# Patient Record
Sex: Female | Born: 1985 | Race: White | Hispanic: No | Marital: Married | Smoking: Never smoker
Health system: Southern US, Community
[De-identification: ages and names within clinical notes are randomized; demographics above are authoritative.]

## PROBLEM LIST (undated history)

## (undated) ENCOUNTER — Inpatient Hospital Stay (HOSPITAL_COMMUNITY): Payer: Self-pay

## (undated) DIAGNOSIS — Z789 Other specified health status: Secondary | ICD-10-CM

---

## 2011-07-06 HISTORY — PX: VAGINAL SEPTUM RESECTION: SHX2644

## 2017-12-27 DIAGNOSIS — J069 Acute upper respiratory infection, unspecified: Secondary | ICD-10-CM | POA: Diagnosis not present

## 2018-02-17 DIAGNOSIS — Z3201 Encounter for pregnancy test, result positive: Secondary | ICD-10-CM | POA: Diagnosis not present

## 2018-03-20 DIAGNOSIS — Z3481 Encounter for supervision of other normal pregnancy, first trimester: Secondary | ICD-10-CM | POA: Diagnosis not present

## 2018-03-20 LAB — OB RESULTS CONSOLE HIV ANTIBODY (ROUTINE TESTING): HIV: NONREACTIVE

## 2018-03-20 LAB — OB RESULTS CONSOLE RUBELLA ANTIBODY, IGM: Rubella: IMMUNE

## 2018-03-20 LAB — OB RESULTS CONSOLE ANTIBODY SCREEN: Antibody Screen: NEGATIVE

## 2018-03-20 LAB — OB RESULTS CONSOLE RPR: RPR: NONREACTIVE

## 2018-03-20 LAB — OB RESULTS CONSOLE HEPATITIS B SURFACE ANTIGEN: Hepatitis B Surface Ag: NEGATIVE

## 2018-03-20 LAB — OB RESULTS CONSOLE ABO/RH: RH Type: POSITIVE

## 2018-03-20 LAB — OB RESULTS CONSOLE GC/CHLAMYDIA
Chlamydia: NEGATIVE
Gonorrhea: NEGATIVE

## 2018-04-04 DIAGNOSIS — Z3481 Encounter for supervision of other normal pregnancy, first trimester: Secondary | ICD-10-CM | POA: Diagnosis not present

## 2018-04-04 DIAGNOSIS — B850 Pediculosis due to Pediculus humanus capitis: Secondary | ICD-10-CM | POA: Diagnosis not present

## 2018-04-24 DIAGNOSIS — S7002XA Contusion of left hip, initial encounter: Secondary | ICD-10-CM | POA: Diagnosis not present

## 2018-04-24 DIAGNOSIS — Z3A15 15 weeks gestation of pregnancy: Secondary | ICD-10-CM | POA: Diagnosis not present

## 2018-04-24 DIAGNOSIS — R1032 Left lower quadrant pain: Secondary | ICD-10-CM | POA: Diagnosis not present

## 2018-04-24 DIAGNOSIS — O9A212 Injury, poisoning and certain other consequences of external causes complicating pregnancy, second trimester: Secondary | ICD-10-CM | POA: Diagnosis not present

## 2018-04-24 DIAGNOSIS — M25551 Pain in right hip: Secondary | ICD-10-CM | POA: Diagnosis not present

## 2018-04-24 DIAGNOSIS — M25552 Pain in left hip: Secondary | ICD-10-CM | POA: Diagnosis not present

## 2018-04-24 DIAGNOSIS — S79912A Unspecified injury of left hip, initial encounter: Secondary | ICD-10-CM | POA: Diagnosis not present

## 2018-05-01 DIAGNOSIS — M542 Cervicalgia: Secondary | ICD-10-CM | POA: Diagnosis not present

## 2018-05-01 DIAGNOSIS — M25511 Pain in right shoulder: Secondary | ICD-10-CM | POA: Diagnosis not present

## 2018-05-08 DIAGNOSIS — M25511 Pain in right shoulder: Secondary | ICD-10-CM | POA: Diagnosis not present

## 2018-05-08 DIAGNOSIS — M542 Cervicalgia: Secondary | ICD-10-CM | POA: Diagnosis not present

## 2018-05-08 DIAGNOSIS — M62830 Muscle spasm of back: Secondary | ICD-10-CM | POA: Diagnosis not present

## 2018-05-12 DIAGNOSIS — M25511 Pain in right shoulder: Secondary | ICD-10-CM | POA: Diagnosis not present

## 2018-05-12 DIAGNOSIS — M62830 Muscle spasm of back: Secondary | ICD-10-CM | POA: Diagnosis not present

## 2018-05-12 DIAGNOSIS — M542 Cervicalgia: Secondary | ICD-10-CM | POA: Diagnosis not present

## 2018-05-17 DIAGNOSIS — M62830 Muscle spasm of back: Secondary | ICD-10-CM | POA: Diagnosis not present

## 2018-05-17 DIAGNOSIS — M25511 Pain in right shoulder: Secondary | ICD-10-CM | POA: Diagnosis not present

## 2018-05-17 DIAGNOSIS — M542 Cervicalgia: Secondary | ICD-10-CM | POA: Diagnosis not present

## 2018-05-19 DIAGNOSIS — M25511 Pain in right shoulder: Secondary | ICD-10-CM | POA: Diagnosis not present

## 2018-05-19 DIAGNOSIS — M542 Cervicalgia: Secondary | ICD-10-CM | POA: Diagnosis not present

## 2018-05-19 DIAGNOSIS — M62830 Muscle spasm of back: Secondary | ICD-10-CM | POA: Diagnosis not present

## 2018-05-23 DIAGNOSIS — M62830 Muscle spasm of back: Secondary | ICD-10-CM | POA: Diagnosis not present

## 2018-05-23 DIAGNOSIS — M542 Cervicalgia: Secondary | ICD-10-CM | POA: Diagnosis not present

## 2018-05-23 DIAGNOSIS — M25511 Pain in right shoulder: Secondary | ICD-10-CM | POA: Diagnosis not present

## 2018-05-24 DIAGNOSIS — M542 Cervicalgia: Secondary | ICD-10-CM | POA: Diagnosis not present

## 2018-05-24 DIAGNOSIS — M25511 Pain in right shoulder: Secondary | ICD-10-CM | POA: Diagnosis not present

## 2018-05-24 DIAGNOSIS — M62830 Muscle spasm of back: Secondary | ICD-10-CM | POA: Diagnosis not present

## 2018-05-26 DIAGNOSIS — M25511 Pain in right shoulder: Secondary | ICD-10-CM | POA: Diagnosis not present

## 2018-05-26 DIAGNOSIS — M62830 Muscle spasm of back: Secondary | ICD-10-CM | POA: Diagnosis not present

## 2018-05-26 DIAGNOSIS — M542 Cervicalgia: Secondary | ICD-10-CM | POA: Diagnosis not present

## 2018-05-28 DIAGNOSIS — M542 Cervicalgia: Secondary | ICD-10-CM | POA: Diagnosis not present

## 2018-05-28 DIAGNOSIS — M62830 Muscle spasm of back: Secondary | ICD-10-CM | POA: Diagnosis not present

## 2018-05-28 DIAGNOSIS — M25511 Pain in right shoulder: Secondary | ICD-10-CM | POA: Diagnosis not present

## 2018-06-06 DIAGNOSIS — M62830 Muscle spasm of back: Secondary | ICD-10-CM | POA: Diagnosis not present

## 2018-06-06 DIAGNOSIS — M542 Cervicalgia: Secondary | ICD-10-CM | POA: Diagnosis not present

## 2018-06-06 DIAGNOSIS — M25511 Pain in right shoulder: Secondary | ICD-10-CM | POA: Diagnosis not present

## 2018-06-13 DIAGNOSIS — M542 Cervicalgia: Secondary | ICD-10-CM | POA: Diagnosis not present

## 2018-06-13 DIAGNOSIS — M25511 Pain in right shoulder: Secondary | ICD-10-CM | POA: Diagnosis not present

## 2018-06-13 DIAGNOSIS — M62830 Muscle spasm of back: Secondary | ICD-10-CM | POA: Diagnosis not present

## 2018-06-15 DIAGNOSIS — M62830 Muscle spasm of back: Secondary | ICD-10-CM | POA: Diagnosis not present

## 2018-06-15 DIAGNOSIS — M542 Cervicalgia: Secondary | ICD-10-CM | POA: Diagnosis not present

## 2018-06-15 DIAGNOSIS — M25511 Pain in right shoulder: Secondary | ICD-10-CM | POA: Diagnosis not present

## 2018-06-20 DIAGNOSIS — M542 Cervicalgia: Secondary | ICD-10-CM | POA: Diagnosis not present

## 2018-06-20 DIAGNOSIS — M25511 Pain in right shoulder: Secondary | ICD-10-CM | POA: Diagnosis not present

## 2018-06-20 DIAGNOSIS — M62830 Muscle spasm of back: Secondary | ICD-10-CM | POA: Diagnosis not present

## 2018-06-26 ENCOUNTER — Other Ambulatory Visit: Payer: Self-pay | Admitting: Family Medicine

## 2018-06-26 DIAGNOSIS — M542 Cervicalgia: Secondary | ICD-10-CM

## 2018-07-05 NOTE — L&D Delivery Note (Signed)
Delivery Note At 4:24 AM a viable female was delivered via Vaginal, Spontaneous (Presentation: DOA;  ).  APGAR: 9, 9; weight  . pending  Placenta status: spontaneous, intact, .  Cord: 3vc with the following complications: noen .  Cord pH: n/a  Anesthesia:  epidural Episiotomy: None Lacerations: 3rd degree;Perineal Suture Repair: 2.0 vicryl rapide used in P-I-S fashion (all stitches thrown first, then tied) after first securing hemostasis with 2 figure of 8s in deeper vaginal tissue where bleeding varicosities were noted. 3-0 vicryl to close remainder of laceration in standard fashion. Rectal exam confirmed integrity.  Est. Blood Loss (mL): 840  Mom to postpartum.  Baby to Couplet care / Skin to Skin.  Lendon Colonel 10/06/2018, 5:12 AM

## 2018-07-11 ENCOUNTER — Ambulatory Visit
Admission: RE | Admit: 2018-07-11 | Discharge: 2018-07-11 | Disposition: A | Discharge status: Home / Self Care | Payer: 59 | Source: Ambulatory Visit | Attending: Family Medicine | Admitting: Family Medicine

## 2018-07-11 DIAGNOSIS — M542 Cervicalgia: Secondary | ICD-10-CM

## 2018-07-20 DIAGNOSIS — O99012 Anemia complicating pregnancy, second trimester: Secondary | ICD-10-CM | POA: Diagnosis not present

## 2018-07-20 DIAGNOSIS — Z3A27 27 weeks gestation of pregnancy: Secondary | ICD-10-CM | POA: Diagnosis not present

## 2018-08-02 DIAGNOSIS — Z23 Encounter for immunization: Secondary | ICD-10-CM | POA: Diagnosis not present

## 2018-08-15 ENCOUNTER — Other Ambulatory Visit: Payer: Self-pay

## 2018-08-15 ENCOUNTER — Encounter (HOSPITAL_COMMUNITY): Payer: Self-pay

## 2018-08-15 ENCOUNTER — Inpatient Hospital Stay (HOSPITAL_COMMUNITY)
Admission: AD | Admit: 2018-08-15 | Discharge: 2018-08-15 | Disposition: A | Discharge status: Home / Self Care | Payer: 59 | Source: Ambulatory Visit | Attending: Obstetrics | Admitting: Obstetrics

## 2018-08-15 DIAGNOSIS — R0602 Shortness of breath: Secondary | ICD-10-CM | POA: Insufficient documentation

## 2018-08-15 DIAGNOSIS — O26893 Other specified pregnancy related conditions, third trimester: Secondary | ICD-10-CM | POA: Insufficient documentation

## 2018-08-15 DIAGNOSIS — Z3A31 31 weeks gestation of pregnancy: Secondary | ICD-10-CM | POA: Insufficient documentation

## 2018-08-15 HISTORY — DX: Other specified health status: Z78.9

## 2018-08-15 LAB — COMPREHENSIVE METABOLIC PANEL
ALBUMIN: 3.1 g/dL — AB (ref 3.5–5.0)
ALK PHOS: 72 U/L (ref 38–126)
ALT: 7 U/L (ref 0–44)
AST: 15 U/L (ref 15–41)
Anion gap: 8 (ref 5–15)
BUN: 9 mg/dL (ref 6–20)
CO2: 22 mmol/L (ref 22–32)
Calcium: 8.4 mg/dL — ABNORMAL LOW (ref 8.9–10.3)
Chloride: 100 mmol/L (ref 98–111)
Creatinine, Ser: 0.58 mg/dL (ref 0.44–1.00)
GFR calc Af Amer: >60 mL/min (ref >60)
GFR calc non Af Amer: >60 mL/min (ref >60)
GLUCOSE: 76 mg/dL (ref 70–99)
Potassium: 3.5 mmol/L (ref 3.5–5.1)
Sodium: 130 mmol/L — ABNORMAL LOW (ref 135–145)
Total Bilirubin: 0.8 mg/dL (ref 0.3–1.2)
Total Protein: 6.1 g/dL — ABNORMAL LOW (ref 6.5–8.1)

## 2018-08-15 LAB — CBC
HCT: 31.1 % — ABNORMAL LOW (ref 36.0–46.0)
HEMOGLOBIN: 10.5 g/dL — AB (ref 12.0–15.0)
MCH: 31.8 pg (ref 26.0–34.0)
MCHC: 33.8 g/dL (ref 30.0–36.0)
MCV: 94.2 fL (ref 80.0–100.0)
Platelets: 145 10*3/uL — ABNORMAL LOW (ref 150–400)
RBC: 3.3 MIL/uL — ABNORMAL LOW (ref 3.87–5.11)
RDW: 12.3 % (ref 11.5–15.5)
WBC: 8.9 10*3/uL (ref 4.0–10.5)
nRBC: 0 % (ref 0.0–0.2)

## 2018-08-15 LAB — URINALYSIS, ROUTINE W REFLEX MICROSCOPIC
Bilirubin Urine: NEGATIVE
Glucose, UA: NEGATIVE mg/dL
Hgb urine dipstick: NEGATIVE
Ketones, ur: 15 mg/dL — AB
Nitrite: NEGATIVE
Protein, ur: NEGATIVE mg/dL
Specific Gravity, Urine: 1.01 (ref 1.005–1.030)
pH: 7 (ref 5.0–8.0)

## 2018-08-15 LAB — URINALYSIS, MICROSCOPIC (REFLEX)
Bacteria, UA: NONE SEEN
RBC / HPF: NONE SEEN RBC/hpf (ref 0–5)

## 2018-08-15 LAB — TSH: TSH: 1.388 u[IU]/mL (ref 0.350–4.500)

## 2018-08-15 MED ORDER — SODIUM CHLORIDE 0.9 % IV BOLUS
1000.0000 mL | Freq: Once | INTRAVENOUS | Status: AC
  Administered 2018-08-15: 1000 mL via INTRAVENOUS

## 2018-08-15 MED ORDER — ALPRAZOLAM 0.5 MG PO TABS: 0.5000 mg | ORAL_TABLET | ORAL | 0 refills | Status: DC | PRN

## 2018-08-15 MED ORDER — ALPRAZOLAM 0.5 MG PO TABS
0.5000 mg | ORAL_TABLET | Freq: Once | ORAL | Status: AC
  Administered 2018-08-15: 0.5 mg via ORAL
  Filled 2018-08-15: qty 1

## 2018-08-15 MED ORDER — PANTOPRAZOLE SODIUM 40 MG PO TBEC: 40.0000 mg | DELAYED_RELEASE_TABLET | Freq: Every day | ORAL | 1 refills | Status: DC

## 2018-08-15 MED ORDER — FAMOTIDINE IN NACL 20-0.9 MG/50ML-% IV SOLN
40.0000 mg | Freq: Once | INTRAVENOUS | Status: AC
  Administered 2018-08-15: 40 mg via INTRAVENOUS
  Filled 2018-08-15: qty 100

## 2018-08-15 MED ORDER — ALUM & MAG HYDROXIDE-SIMETH 200-200-20 MG/5ML PO SUSP
30.0000 mL | Freq: Once | ORAL | Status: AC
  Administered 2018-08-15: 30 mL via ORAL
  Filled 2018-08-15: qty 30

## 2018-08-15 NOTE — MAU Note (Signed)
Pt. States she began feeling SOB last night around 11:30pm.  Pt was seen in office this morning for same complaint.  Pt denies any pain and reports good fetal movement.  Pt denies LOF or vaginal bleeding.  Pt reports some dizziness at office visit.

## 2018-08-15 NOTE — H&P (Signed)
Chief complaint shortness of breath  History of present illness 33 year old G3 P2 at 31 weeks and 4 days who developed shortness of breath last night.  This started after she went up the stairs.  Eventually she was able to sleep and slept without problem.  This a.m. when she woke up and walked downstairs a shortness of breath returned.  Patient notes no cough, no hemoptysis, no fever, no headache, no vision change, no chest pain.  Patient does note history of anxiety and prior pregnancies but has not been treated for anxiety or depression in the past.  Patient does note worsening heartburn through this pregnancy.  Patient has been followed for thrombocytopenia.  Patient does not smoke.  Patient has no personal or family history of venous thromboembolism.  Patient reports no swelling in her legs or new onset edema.  This morning she presented to the office for an evaluation.  Initial orthostatics were positive for increase in her pulse but after several minutes in the office orthostatics were negative blood pressure was normal pulse remained in the mid 90s with oxygen saturation at 100%.  She had an normal exam other than appearing dyspneic.  Patient was sent MAU for further evaluation  Past Medical History:  Diagnosis Date  . Medical history non-contributory     Past Surgical History:  Procedure Laterality Date  . NO PAST SURGERIES      Past obstetric history: 2 prior vaginal deliveries  Physical exam:  Vitals:   08/15/18 1201  BP: 106/65  Pulse: 98  Resp: 20  Temp: 98.1 F (36.7 C)  SpO2: 100%  Weight: 73.9 kg  Height: 5\' 9"  (1.753 m)   MAU course: Patient received 1 L IV fluids, labs are drawn, she received a GI cocktail and IV Pepcid.  After that her shortness of breath resolved.  Due to delay of pharmacy her Xanax was given after other interventions.  That made her feel sleepy but did not further improve her course.  Exam repeated by me after interventions: General:  Well-appearing, no distress Skin: Warm and dry Neck: No thyromegaly Cardiovascular: Regular rate and rhythm, no murmur rub or gallop Pulmonary: Clear to auscultation bilaterally Abdomen: No right upper quadrant pain, no costovertebral angle tenderness, no fundal tenderness GU: Deferred Lower extremity: Nontender, no edema, cluster of spider veins along the right ankle  Toco: None NST: 145's, positive acceleration, no decelerations, 10 beat variability   CBC    Component Value Date/Time   WBC 8.9 08/15/2018 1238   RBC 3.30 (L) 08/15/2018 1238   HGB 10.5 (L) 08/15/2018 1238   HCT 31.1 (L) 08/15/2018 1238   PLT 145 (L) 08/15/2018 1238   MCV 94.2 08/15/2018 1238   MCH 31.8 08/15/2018 1238   MCHC 33.8 08/15/2018 1238   RDW 12.3 08/15/2018 1238    CMP     Component Value Date/Time   NA 130 (L) 08/15/2018 1238   K 3.5 08/15/2018 1238   CL 100 08/15/2018 1238   CO2 22 08/15/2018 1238   GLUCOSE 76 08/15/2018 1238   BUN 9 08/15/2018 1238   CREATININE 0.58 08/15/2018 1238   CALCIUM 8.4 (L) 08/15/2018 1238   PROT 6.1 (L) 08/15/2018 1238   ALBUMIN 3.1 (L) 08/15/2018 1238   AST 15 08/15/2018 1238   ALT 7 08/15/2018 1238   ALKPHOS 72 08/15/2018 1238   BILITOT 0.8 08/15/2018 1238   GFRNONAA >60 08/15/2018 1238   GFRAA >60 08/15/2018 1238   TSH pending  Assessment and plan:  33 year old G3 P2 with acute shortness of breath of unclear etiology.  Suspect combination of GERD and possible anxiety.  Now with evidence of infection, suspicion for DVT/PE is low and further imaging is deferred.  Warning symptoms reviewed with patient.  Will start on Protonix and give as needed Xanax.  Continue routine office follow-up  Reactive fetal testing   Lendon Colonel 08/15/2018 2:33 PM

## 2018-08-15 NOTE — MAU Provider Note (Signed)
Ms. Kristine Collins is a 33 y.o. [id number] at [redacted]w[redacted]d who present to MAU today with shortness of breath. She was sent from the office by her OB provider. Her OB provider is on her way to see her, but it has been >30 mins since notification. Patient report SOB since last PM. It did resolve for her to sleep last night, and then returned this morning upon waking. She denies any contractions, VB or LOF. She reports normal fetal movement.   BP 106/65 (BP Location: Right Arm)   Pulse 98   Temp 98.1 F (36.7 C)   Resp 20   Ht 5\' 9"  (1.753 m)   Wt 73.9 kg   SpO2 100%   BMI 24.07 kg/m  CONSTITUTIONAL: Well-developed, well-nourished female in no acute distress.  CARDIOVASCULAR: Normal heart rate noted RESPIRATORY: Effort and breath sounds normal GASTROINTESTINAL:Soft, no distention noted.  No tenderness, rebound or guarding.  SKIN: Skin is warm and dry. No rash noted. Not diaphoretic. No erythema. No pallor. PSYCHIATRIC: Normal mood and affect. Normal behavior. Normal judgment and thought content.  MDM Medical screening exam complete OB provider on her way to evaluate patient.   A:  Shortness of breath   P: Await private for complete evaluation.   Thressa Sheller DNP, CNM  08/15/18  12:47 PM

## 2018-09-19 DIAGNOSIS — Z3685 Encounter for antenatal screening for Streptococcus B: Secondary | ICD-10-CM | POA: Diagnosis not present

## 2018-09-19 DIAGNOSIS — Z3A36 36 weeks gestation of pregnancy: Secondary | ICD-10-CM | POA: Diagnosis not present

## 2018-09-19 DIAGNOSIS — O99013 Anemia complicating pregnancy, third trimester: Secondary | ICD-10-CM | POA: Diagnosis not present

## 2018-09-19 LAB — OB RESULTS CONSOLE GBS: GBS: NEGATIVE

## 2018-09-27 DIAGNOSIS — Z3A37 37 weeks gestation of pregnancy: Secondary | ICD-10-CM | POA: Diagnosis not present

## 2018-09-27 DIAGNOSIS — O99013 Anemia complicating pregnancy, third trimester: Secondary | ICD-10-CM | POA: Diagnosis not present

## 2018-10-02 ENCOUNTER — Telehealth (HOSPITAL_COMMUNITY): Payer: Self-pay | Admitting: *Deleted

## 2018-10-02 ENCOUNTER — Encounter (HOSPITAL_COMMUNITY): Payer: Self-pay | Admitting: *Deleted

## 2018-10-02 NOTE — Telephone Encounter (Signed)
Preadmission screen  

## 2018-10-03 ENCOUNTER — Encounter (HOSPITAL_COMMUNITY): Payer: Self-pay | Admitting: *Deleted

## 2018-10-04 ENCOUNTER — Other Ambulatory Visit: Payer: Self-pay | Admitting: Obstetrics

## 2018-10-05 ENCOUNTER — Other Ambulatory Visit (HOSPITAL_COMMUNITY): Payer: Self-pay | Admitting: *Deleted

## 2018-10-05 NOTE — H&P (Addendum)
Kristine Collins is Kristine 33 y.o. [id number] at [redacted]w[redacted]d presenting for IOL. Pt notes intermittent contractions . Good fetal movement, No vaginal bleeding, not leaking fluid.  P[facility]are at [facility] since 10 wks  - severe anxiety/ intolerance to exam. Will need early epidural. 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 which led to painful IC/ exams and has created anxiety and intolerance to exam - anxiety/ panic attacks, new in 2nd trimester, had several evalulations for chest pain/ SOB with final dx of anxiety - gestational thrombocytopenia    Prenatal Transfer Tool  Maternal Diabetes: No Genetic Screening: Normal Maternal Ultrasounds/Referrals: Normal Fetal Ultrasounds or other Referrals:  None Maternal Substance Abuse:  No Significant Maternal Medications:  None Significant Maternal Lab Results: None     OB History    Gravida  3   Para  2   Term  2   Preterm      AB      Living  2     SAB      TAB      Ectopic      Multiple      Live Births  2          Past Medical History:  Diagnosis Date  . Medical history non-contributory    Past Surgical History:  Procedure Laterality Date  . NO PAST SURGERIES     Family History: family history includes Hypertension in her father and mother. Social History:  reports that she has never smoked. She has never used smokeless tobacco. She reports previous alcohol use. She reports previous drug use.  Review of Systems - Negative except anxiety over delivery/ exams      Physical Exam:  Gen: well appearing, no distress  Back: no CVAT Abd: gravid, NT, no RUQ pain LE: no edema, equal bilaterally, non-tender   Prenatal labs: ABO, Rh: O/Positive/-- (09/16 0000) Antibody: Negative (09/16 0000) Rubella: Immune (09/16 0000) RPR: Nonreactive (09/16 0000)  HBsAg: Negative (09/16 0000)  HIV: Non-reactive (09/16 0000)  GBS: Negative (03/17 0000)  1 hr Glucola 131  Genetic screening nl  NT Anatomy US normal   Assessment/Plan: 33 y.o. [id number] at [redacted]w[redacted]d - term IOL, plan pitocin, AROM when able, after epidural - early epidural  - gestational thrombocytopenia, last plt 125  Kristine Collins Kristine Collins 10/05/2018, 10:05 PM

## 2018-10-06 ENCOUNTER — Encounter (HOSPITAL_COMMUNITY): Payer: Self-pay

## 2018-10-06 ENCOUNTER — Inpatient Hospital Stay (HOSPITAL_COMMUNITY): Payer: 59 | Admitting: Anesthesiology

## 2018-10-06 ENCOUNTER — Inpatient Hospital Stay (HOSPITAL_COMMUNITY)
Admission: AD | Admit: 2018-10-06 | Discharge: 2018-10-07 | DRG: 768 | Disposition: A | Discharge status: Home / Self Care | Payer: 59 | Attending: Obstetrics | Admitting: Obstetrics

## 2018-10-06 ENCOUNTER — Other Ambulatory Visit: Payer: Self-pay

## 2018-10-06 ENCOUNTER — Inpatient Hospital Stay (HOSPITAL_COMMUNITY): Payer: 59

## 2018-10-06 DIAGNOSIS — O99344 Other mental disorders complicating childbirth: Secondary | ICD-10-CM | POA: Diagnosis present

## 2018-10-06 DIAGNOSIS — D62 Acute posthemorrhagic anemia: Secondary | ICD-10-CM | POA: Diagnosis not present

## 2018-10-06 DIAGNOSIS — O9912 Other diseases of the blood and blood-forming organs and certain disorders involving the immune mechanism complicating childbirth: Secondary | ICD-10-CM | POA: Diagnosis not present

## 2018-10-06 DIAGNOSIS — O9081 Anemia of the puerperium: Secondary | ICD-10-CM | POA: Diagnosis not present

## 2018-10-06 DIAGNOSIS — Q5210 Doubling of vagina, unspecified: Secondary | ICD-10-CM

## 2018-10-06 DIAGNOSIS — D6959 Other secondary thrombocytopenia: Secondary | ICD-10-CM | POA: Diagnosis present

## 2018-10-06 DIAGNOSIS — F419 Anxiety disorder, unspecified: Secondary | ICD-10-CM | POA: Diagnosis present

## 2018-10-06 DIAGNOSIS — Z3A39 39 weeks gestation of pregnancy: Secondary | ICD-10-CM | POA: Diagnosis not present

## 2018-10-06 DIAGNOSIS — O26893 Other specified pregnancy related conditions, third trimester: Secondary | ICD-10-CM | POA: Diagnosis not present

## 2018-10-06 DIAGNOSIS — O3463 Maternal care for abnormality of vagina, third trimester: Secondary | ICD-10-CM | POA: Diagnosis present

## 2018-10-06 DIAGNOSIS — Z3490 Encounter for supervision of normal pregnancy, unspecified, unspecified trimester: Secondary | ICD-10-CM | POA: Diagnosis present

## 2018-10-06 LAB — CBC
HCT: 33.6 % — ABNORMAL LOW (ref 36.0–46.0)
HCT: 32 % — ABNORMAL LOW (ref 36.0–46.0)
Hemoglobin: 11.1 g/dL — ABNORMAL LOW (ref 12.0–15.0)
Hemoglobin: 10.3 g/dL — ABNORMAL LOW (ref 12.0–15.0)
MCH: 30.6 pg (ref 26.0–34.0)
MCH: 29.9 pg (ref 26.0–34.0)
MCHC: 33 g/dL (ref 30.0–36.0)
MCHC: 32.2 g/dL (ref 30.0–36.0)
MCV: 92.6 fL (ref 80.0–100.0)
MCV: 92.8 fL (ref 80.0–100.0)
Platelets: 140 10*3/uL — ABNORMAL LOW (ref 150–400)
Platelets: 132 10*3/uL — ABNORMAL LOW (ref 150–400)
RBC: 3.63 MIL/uL — ABNORMAL LOW (ref 3.87–5.11)
RBC: 3.45 MIL/uL — ABNORMAL LOW (ref 3.87–5.11)
RDW: 12.8 % (ref 11.5–15.5)
RDW: 12.9 % (ref 11.5–15.5)
WBC: 9.4 10*3/uL (ref 4.0–10.5)
WBC: 15.7 10*3/uL — ABNORMAL HIGH (ref 4.0–10.5)
nRBC: 0 % (ref 0.0–0.2)
nRBC: 0 % (ref 0.0–0.2)

## 2018-10-06 LAB — TYPE AND SCREEN
ABO/RH(D): O POS
Antibody Screen: NEGATIVE

## 2018-10-06 LAB — ABO/RH: ABO/RH(D): O POS

## 2018-10-06 LAB — RPR: RPR Ser Ql: NONREACTIVE

## 2018-10-06 MED ORDER — LIDOCAINE-EPINEPHRINE (PF) 2 %-1:200000 IJ SOLN
INTRAMUSCULAR | Status: DC | PRN
  Administered 2018-10-06: 5 mL via EPIDURAL
  Administered 2018-10-06: 2 mL via EPIDURAL

## 2018-10-06 MED ORDER — SIMETHICONE 80 MG PO CHEW: 80.0000 mg | CHEWABLE_TABLET | ORAL | Status: DC | PRN

## 2018-10-06 MED ORDER — DIPHENHYDRAMINE HCL 25 MG PO CAPS: 25.0000 mg | ORAL_CAPSULE | Freq: Four times a day (QID) | ORAL | Status: DC | PRN

## 2018-10-06 MED ORDER — EPHEDRINE 5 MG/ML INJ: 10.0000 mg | INTRAVENOUS | Status: DC | PRN

## 2018-10-06 MED ORDER — BENZOCAINE-MENTHOL 20-0.5 % EX AERO
1.0000 "application " | INHALATION_SPRAY | CUTANEOUS | Status: DC | PRN
  Administered 2018-10-06: 1 via TOPICAL
  Filled 2018-10-06: qty 56

## 2018-10-06 MED ORDER — MAGNESIUM OXIDE 400 (241.3 MG) MG PO TABS
400.0000 mg | ORAL_TABLET | Freq: Every day | ORAL | Status: DC
  Administered 2018-10-07: 400 mg via ORAL
  Filled 2018-10-06: qty 1

## 2018-10-06 MED ORDER — OXYCODONE HCL 5 MG PO TABS
5.0000 mg | ORAL_TABLET | ORAL | Status: DC | PRN
  Administered 2018-10-06 (×2): 5 mg via ORAL
  Filled 2018-10-06 (×2): qty 1

## 2018-10-06 MED ORDER — TETANUS-DIPHTH-ACELL PERTUSSIS 5-2.5-18.5 LF-MCG/0.5 IM SUSP: 0.5000 mL | Freq: Once | INTRAMUSCULAR | Status: DC

## 2018-10-06 MED ORDER — ONDANSETRON HCL 4 MG/2ML IJ SOLN: 4.0000 mg | Freq: Four times a day (QID) | INTRAMUSCULAR | Status: DC | PRN

## 2018-10-06 MED ORDER — PRENATAL MULTIVITAMIN CH
1.0000 | ORAL_TABLET | Freq: Every day | ORAL | Status: DC
  Administered 2018-10-06 – 2018-10-07 (×2): 1 via ORAL
  Filled 2018-10-06 (×2): qty 1

## 2018-10-06 MED ORDER — TERBUTALINE SULFATE 1 MG/ML IJ SOLN: 0.2500 mg | Freq: Once | INTRAMUSCULAR | Status: DC | PRN

## 2018-10-06 MED ORDER — PHENYLEPHRINE 40 MCG/ML (10ML) SYRINGE FOR IV PUSH (FOR BLOOD PRESSURE SUPPORT): 80.0000 ug | PREFILLED_SYRINGE | INTRAVENOUS | Status: DC | PRN

## 2018-10-06 MED ORDER — ACETAMINOPHEN 325 MG PO TABS: 650.0000 mg | ORAL_TABLET | ORAL | Status: DC | PRN

## 2018-10-06 MED ORDER — ONDANSETRON HCL 4 MG/2ML IJ SOLN: 4.0000 mg | INTRAMUSCULAR | Status: DC | PRN

## 2018-10-06 MED ORDER — OXYTOCIN 40 UNITS IN NORMAL SALINE INFUSION - SIMPLE MED: 2.5000 [IU]/h | INTRAVENOUS | Status: DC

## 2018-10-06 MED ORDER — SODIUM CHLORIDE (PF) 0.9 % IJ SOLN
INTRAMUSCULAR | Status: DC | PRN
  Administered 2018-10-06: 12 mL/h via EPIDURAL

## 2018-10-06 MED ORDER — COCONUT OIL OIL: 1.0000 "application " | TOPICAL_OIL | Status: DC | PRN

## 2018-10-06 MED ORDER — DIPHENHYDRAMINE HCL 50 MG/ML IJ SOLN: 12.5000 mg | INTRAMUSCULAR | Status: DC | PRN

## 2018-10-06 MED ORDER — OXYTOCIN 40 UNITS IN NORMAL SALINE INFUSION - SIMPLE MED
1.0000 m[IU]/min | INTRAVENOUS | Status: DC
  Administered 2018-10-06: 2 m[IU]/min via INTRAVENOUS
  Filled 2018-10-06: qty 1000

## 2018-10-06 MED ORDER — IBUPROFEN 600 MG PO TABS
600.0000 mg | ORAL_TABLET | Freq: Four times a day (QID) | ORAL | Status: DC
  Administered 2018-10-06 – 2018-10-07 (×5): 600 mg via ORAL
  Filled 2018-10-06 (×5): qty 1

## 2018-10-06 MED ORDER — LACTATED RINGERS IV SOLN: 500.0000 mL | Freq: Once | INTRAVENOUS | Status: DC

## 2018-10-06 MED ORDER — LACTATED RINGERS IV SOLN: 500.0000 mL | INTRAVENOUS | Status: DC | PRN

## 2018-10-06 MED ORDER — FENTANYL-BUPIVACAINE-NACL 0.5-0.125-0.9 MG/250ML-% EP SOLN
12.0000 mL/h | EPIDURAL | Status: DC | PRN
  Filled 2018-10-06: qty 250

## 2018-10-06 MED ORDER — ZOLPIDEM TARTRATE 5 MG PO TABS: 5.0000 mg | ORAL_TABLET | Freq: Every evening | ORAL | Status: DC | PRN

## 2018-10-06 MED ORDER — ACETAMINOPHEN 325 MG PO TABS
650.0000 mg | ORAL_TABLET | ORAL | Status: DC | PRN
  Administered 2018-10-06: 650 mg via ORAL
  Filled 2018-10-06: qty 2

## 2018-10-06 MED ORDER — DIBUCAINE 1 % RE OINT: 1.0000 "application " | TOPICAL_OINTMENT | RECTAL | Status: DC | PRN

## 2018-10-06 MED ORDER — WITCH HAZEL-GLYCERIN EX PADS: 1.0000 "application " | MEDICATED_PAD | CUTANEOUS | Status: DC | PRN

## 2018-10-06 MED ORDER — SOD CITRATE-CITRIC ACID 500-334 MG/5ML PO SOLN: 30.0000 mL | ORAL | Status: DC | PRN

## 2018-10-06 MED ORDER — ONDANSETRON HCL 4 MG PO TABS: 4.0000 mg | ORAL_TABLET | ORAL | Status: DC | PRN

## 2018-10-06 MED ORDER — LACTATED RINGERS IV SOLN
INTRAVENOUS | Status: DC
  Administered 2018-10-06: 01:00:00 via INTRAVENOUS

## 2018-10-06 MED ORDER — OXYCODONE HCL 5 MG PO TABS: 10.0000 mg | ORAL_TABLET | ORAL | Status: DC | PRN

## 2018-10-06 MED ORDER — OXYTOCIN BOLUS FROM INFUSION
500.0000 mL | Freq: Once | INTRAVENOUS | Status: AC
  Administered 2018-10-06: 500 mL via INTRAVENOUS

## 2018-10-06 MED ORDER — SENNOSIDES-DOCUSATE SODIUM 8.6-50 MG PO TABS
2.0000 | ORAL_TABLET | ORAL | Status: DC
  Administered 2018-10-06: 2 via ORAL
  Administered 2018-10-07: 1 via ORAL
  Filled 2018-10-06 (×2): qty 2

## 2018-10-06 MED ORDER — LIDOCAINE HCL (PF) 1 % IJ SOLN: 30.0000 mL | INTRAMUSCULAR | Status: DC | PRN

## 2018-10-06 NOTE — H&P (Signed)
Note originally created 2 hours prior to admission, updated and copied here so H&P dated on day of admssion:  Kristine Collins is a 33 y.o. [id number] at [redacted]w[redacted]d presenting for IOL. Upon admission pt notes intermittent contractions . Good fetal movement, No vaginal bleeding, not leaking fluid.  P[facility]are at [facility] since 10 wks  - severe anxiety/ intolerance to exam. Will need early epidural. 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 which led to painful IC/ exams and has created anxiety and intolerance to exam - anxiety/ panic attacks, new in 2nd trimester, had several evalulations for chest pain/ SOB with final dx of anxiety - gestational thrombocytopenia    Prenatal Transfer Tool  Maternal Diabetes: No Genetic Screening: Normal Maternal Ultrasounds/Referrals: Normal Fetal Ultrasounds or other Referrals:  None Maternal Substance Abuse:  No Significant Maternal Medications:  None Significant Maternal Lab Results: None             OB History    Gravida  3   Para  2   Term  2   Preterm      AB      Living  2     SAB      TAB      Ectopic      Multiple      Live Births  2              Past Medical History:  Diagnosis Date  . Medical history non-contributory         Past Surgical History:  Procedure Laterality Date  . NO PAST SURGERIES     Family History: family history includes Hypertension in her father and mother. Social History:  reports that she has never smoked. She has never used smokeless tobacco. She reports previous alcohol use. She reports previous drug use.  Review of Systems - Negative except anxiety over delivery/ exams    Physical Exam:  Gen: well appearing, no distress  Back: no CVAT Abd: gravid, NT, no RUQ pain LE: no edema, equal bilaterally, non-tender   Prenatal labs: ABO, Rh: O/Positive/-- (09/16 0000) Antibody: Negative (09/16 0000) Rubella: Immune (09/16  0000) RPR: Nonreactive (09/16 0000)  HBsAg: Negative (09/16 0000)  HIV: Non-reactive (09/16 0000)  GBS: Negative (03/17 0000)  1 hr Glucola 131          Genetic screening nl NT Anatomy US normal   CBC    Component Value Date/Time   WBC 9.4 10/06/2018 0100   RBC 3.63 (L) 10/06/2018 0100   HGB 11.1 (L) 10/06/2018 0100   HCT 33.6 (L) 10/06/2018 0100   PLT 140 (L) 10/06/2018 0100   MCV 92.6 10/06/2018 0100   MCH 30.6 10/06/2018 0100   MCHC 33.0 10/06/2018 0100   RDW 12.8 10/06/2018 0100     Assessment/Plan: 32 y.o. [id number] at [redacted]w[redacted]d - term IOL, plan pitocin, AROM when able, after epidural - early epidural             - gestational thrombocytopenia, last plt 125, now 140  Branna Cortina A Cyntha Brickman 10/06/2018 5:11 AM

## 2018-10-06 NOTE — Progress Notes (Signed)
PPD #0 SVD, 3rd degree repair, baby girl   S: Mom, FOB, and baby asleep; delivered at 4:28am this morning   O:               VS: BP 105/70 (BP Location: Right Arm)   Pulse 80   Temp 98.5 F (36.9 C) (Oral)   Resp 18   Ht 5\' 9"  (1.753 m)   Wt 78 kg   SpO2 97%   Breastfeeding Unknown   BMI 25.40 kg/m    LABS:              Recent Labs    10/06/18 0100 10/06/18 0629  WBC 9.4 15.7*  HGB 11.1* 10.3*  PLT 140* 132*               Blood type: --/--/O POS, O POS (04/03 0045)  Rubella: Immune (09/16 0000)                     I&O: Intake/Output      04/02 0701 - 04/03 0700 04/03 0701 - 04/04 0700   I.V. (mL/kg) 410.4 (5.3)    Total Intake(mL/kg) 410.4 (5.3)    Urine (mL/kg/hr) 500    Blood 920    Total Output 1420    Net -1009.6                       Physical Exam:           Deferred    A: PPD # 0, SVD  3rd degree perineal    Gestational thrombocytopenia - plts stable   ABL Anemia   Doing well - stable status  P: Routine post partum orders  Magnesium oxide 400mg  PO daily   Continue Senakot-S 2 tablets at bedtime   Will round in the AM  Carlean Jews, MSN, CNM [facility]

## 2018-10-06 NOTE — Anesthesia Preprocedure Evaluation (Signed)
Anesthesia Evaluation  Patient identified by MRN, date of birth, ID band Patient awake    Reviewed: Allergy & Precautions, H&P , NPO status , Patient's Chart, lab work & pertinent test results  History of Anesthesia Complications Negative for: history of anesthetic complications  Airway Mallampati: II  TM Distance: >3 FB Neck ROM: full    Dental no notable dental hx.    Pulmonary neg pulmonary ROS,    Pulmonary exam normal        Cardiovascular negative cardio ROS Normal cardiovascular exam Rhythm:regular Rate:Normal     Neuro/Psych negative neurological ROS  negative psych ROS   GI/Hepatic negative GI ROS, Neg liver ROS,   Endo/Other  negative endocrine ROS  Renal/GU negative Renal ROS  negative genitourinary   Musculoskeletal   Abdominal   Peds  Hematology Gestational thrombocytopenia   Anesthesia Other Findings   Reproductive/Obstetrics (+) Pregnancy                             Anesthesia Physical Anesthesia Plan  ASA: II  Anesthesia Plan: Epidural   Post-op Pain Management:    Induction:   PONV Risk Score and Plan:   Airway Management Planned:   Additional Equipment:   Intra-op Plan:   Post-operative Plan:   Informed Consent: I have reviewed the patients History and Physical, chart, labs and discussed the procedure including the risks, benefits and alternatives for the proposed anesthesia with the patient or authorized representative who has indicated his/her understanding and acceptance.       Plan Discussed with:   Anesthesia Plan Comments:         Anesthesia Quick Evaluation

## 2018-10-06 NOTE — Lactation Note (Signed)
This note was copied from a baby's chart. Lactation Consultation Note  Patient Name: Girl Arloa Eddington Today's Date: 10/06/2018 Reason for consult: Initial assessment   P3, Ex BF 10 months with both her children. She denies questions or concerns. Reviewed basics.  Feed on demand approximately 8-12 times per day.   Provided mother with lactation brochure for phone and support.  Mother states she will call if she needs lactation assistance.  PRN.   Maternal Data Has patient been taught Hand Expression?: Yes Does the patient have breastfeeding experience prior to this delivery?: Yes  Feeding Feeding Type: Breast Fed  LATCH Score                   Interventions Interventions: Breast feeding basics reviewed  Lactation Tools Discussed/Used     Consult Status Consult Status: PRN    Hardie Pulley 10/06/2018, 11:06 AM

## 2018-10-06 NOTE — Anesthesia Procedure Notes (Signed)
Epidural Patient location during procedure: OB Start time: 10/06/2018 2:27 AM End time: 10/06/2018 2:39 AM  Staffing Anesthesiologist: Lucretia Kern, MD Performed: anesthesiologist   Preanesthetic Checklist Completed: patient identified, pre-op evaluation, timeout performed, IV checked, risks and benefits discussed and monitors and equipment checked  Epidural Patient position: sitting Prep: DuraPrep Patient monitoring: heart rate, continuous pulse ox and blood pressure Approach: midline Location: L3-L4 Injection technique: LOR air  Needle:  Needle type: Tuohy  Needle gauge: 17 G Needle length: 9 cm Needle insertion depth: 4.5 cm Catheter type: closed end flexible Catheter size: 19 Gauge Catheter at skin depth: 9.5 cm Test dose: negative and 2% lidocaine with Epi 1:200 K  Assessment Events: blood not aspirated, injection not painful, no injection resistance, negative IV test and no paresthesia  Additional Notes Reason for block:procedure for pain

## 2018-10-06 NOTE — Anesthesia Postprocedure Evaluation (Signed)
Anesthesia Post Note  Patient: Kristine Collins  Procedure(s) Performed: AN AD HOC LABOR EPIDURAL     Patient location during evaluation: Mother Baby Anesthesia Type: Epidural Level of consciousness: awake, awake and alert and oriented Pain management: pain level controlled Vital Signs Assessment: post-procedure vital signs reviewed and stable Respiratory status: spontaneous breathing and respiratory function stable Cardiovascular status: blood pressure returned to baseline Postop Assessment: no headache, no backache, epidural receding, patient able to bend at knees, no apparent nausea or vomiting, adequate PO intake and able to ambulate Anesthetic complications: no    Last Vitals:  Vitals:   10/06/18 0600 10/06/18 0655  BP: 103/67 105/70  Pulse: 69 80  Resp:  18  Temp:  36.9 C  SpO2:      Last Pain:  Vitals:   10/06/18 0655  TempSrc: Oral  PainSc: 0-No pain   Pain Goal:                   Cleda Clarks

## 2018-10-06 NOTE — Plan of Care (Signed)
Epidural catheter removed intact 

## 2018-10-06 NOTE — Progress Notes (Signed)
Pt voided 500cc of clear yellow urine, ambulated back to bed, bladder scan unable to detect urine, pt's fundus is firm 1 below the umbilicus, lochia is small, pt is comfortable at this time.

## 2018-10-07 MED ORDER — SENNOSIDES-DOCUSATE SODIUM 8.6-50 MG PO TABS: 2.0000 | ORAL_TABLET | ORAL | Status: DC

## 2018-10-07 MED ORDER — COCONUT OIL OIL: 1.0000 "application " | TOPICAL_OIL | 0 refills | Status: DC | PRN

## 2018-10-07 MED ORDER — ACETAMINOPHEN 325 MG PO TABS: 650.0000 mg | ORAL_TABLET | ORAL | Status: DC | PRN

## 2018-10-07 MED ORDER — IBUPROFEN 600 MG PO TABS: 600.0000 mg | ORAL_TABLET | Freq: Four times a day (QID) | ORAL | 0 refills | Status: DC

## 2018-10-07 MED ORDER — BENZOCAINE-MENTHOL 20-0.5 % EX AERO: 1.0000 "application " | INHALATION_SPRAY | CUTANEOUS | Status: DC | PRN

## 2018-10-07 MED ORDER — OXYCODONE HCL 5 MG PO TABS: 5.0000 mg | ORAL_TABLET | ORAL | 0 refills | Status: AC | PRN

## 2018-10-07 NOTE — Discharge Summary (Signed)
Obstetric Discharge Summary Reason for Admission: induction of labor Prenatal Procedures: ultrasound Intrapartum Procedures: spontaneous vaginal delivery and epidural Postpartum Procedures: none Complications-Operative and Postpartum: 3rd degree perineal laceration Hemoglobin  Date Value Ref Range Status  10/06/2018 10.3 (L) 12.0 - 15.0 g/dL Final   HCT  Date Value Ref Range Status  10/06/2018 32.0 (L) 36.0 - 46.0 % Final    Physical Exam:  General: alert, cooperative and no distress Lochia: appropriate Uterine Fundus: firm Incision: healing well DVT Evaluation: No cords or calf tenderness. No significant calf/ankle edema.  Discharge Diagnoses: Term Pregnancy-delivered  Discharge Information: Date: 10/07/2018 Activity: pelvic rest Diet: routine Medications:  Allergies as of 10/07/2018   No Known Allergies     Medication List    TAKE these medications   acetaminophen 325 MG tablet Commonly known as:  Tylenol Take 2 tablets (650 mg total) by mouth every 4 (four) hours as needed (for pain scale < 4).   ALPRAZolam 0.5 MG tablet Commonly known as:  XANAX Take 1 tablet (0.5 mg total) by mouth as needed for anxiety.   benzocaine-Menthol 20-0.5 % Aero Commonly known as:  DERMOPLAST Apply 1 application topically as needed for irritation (perineal discomfort).   coconut oil Oil Apply 1 application topically as needed.   FERRALET 90 PO Take 1 tablet by mouth daily.   ibuprofen 600 MG tablet Commonly known as:  ADVIL,MOTRIN Take 1 tablet (600 mg total) by mouth every 6 (six) hours.   multivitamin-prenatal 27-0.8 MG Tabs tablet Take 1 tablet by mouth daily at 12 noon.   oxyCODONE 5 MG immediate release tablet Commonly known as:  Oxy IR/ROXICODONE Take 1 tablet (5 mg total) by mouth every 4 (four) hours as needed for up to 3 days (pain scale 4-7).   pantoprazole 40 MG tablet Commonly known as:  PROTONIX Take 1 tablet (40 mg total) by mouth daily.   senna-docusate  8.6-50 MG tablet Commonly known as:  Senokot-S Take 2 tablets by mouth daily. Start taking on:  October 08, 2018            Discharge Care Instructions  (From admission, onward)         Start     Ordered   10/07/18 0000  Discharge wound care:    Comments:  Sitz baths 2 times /day with warm water x 1 week   10/07/18 1017         Condition: stable Instructions: refer to practice specific booklet Discharge to: home Follow-up Information    Noland Fordyce, MD. Schedule an appointment as soon as possible for a visit in 6 week(s).   Specialty:  Obstetrics and Gynecology Contact information: [address] [place] [address] [phone]           Newborn Data: Live born female [place] Birth Weight: 8 lb 13.4 oz (4009 g) APGAR: 9, 9  Newborn Delivery   Birth date/time:  10/06/2018 04:24:00 Delivery type:  Vaginal, Spontaneous     Home with mother.  Neta Mends, CNM 10/07/2018, 10:18 AM

## 2018-10-07 NOTE — Progress Notes (Signed)
CSW received consult for [name] due to history of panic attacks and anxiety. CSW met with [name] and infant at bedside to complete assessment. CSW inquired with [name] regarding her panic attacks during pregnancy, [name] confirmed those attacks but stated she was unaware of what was going on at the time but later found out they were panic attacks. [name] reports no significant history of anxiety prior to those attacks. [name] reports that the attacks were sudden onset and that it began after multiple days of being exhausted and shortness of breath. [name] reports that her husband is a pastor and that the family had hosted church members multiple days in a row and she wasn't feeling well at the time. [name] reports that her MD suggested she take some time off to relax and stabilize herself and that it worked well. [name] denies any panic attacks since that time period. [name] reports that she is breastfeeding and that her FOB is very supportive. [name] denies any concerns at this time. CSW educated [name] on baby blues period versus postpartum depression.   Kristine Collins, MSW, LCSW-A Clinical Social Worker [facility] [phone]   

## 2018-10-07 NOTE — Progress Notes (Signed)
Patient ID: Kristine Collins, female   DOB: [birth year], 33 y.o.   MRN: [id number] PPD # 1 S/P NSVD  Live born female  Birth Weight: 8 lb 13.4 oz (4009 g) APGAR: 9, 9  Newborn Delivery   Birth date/time:  10/06/2018 04:24:00 Delivery type:  Vaginal, Spontaneous    Baby name: [place] Delivering provider: Noland Fordyce  Episiotomy:None   Lacerations:3rd degree;Perineal  Feeding: breast  Pain control at delivery: Epidural   S:  Reports feeling well, sore bottom but manageable. Desires early DC             Tolerating po/ No nausea or vomiting             Bleeding is light             Pain controlled with PO meds             Up ad lib / ambulatory / voiding without difficulties   O:  A & O x 3, in no apparent distress              VS:  Vitals:   10/06/18 1835 10/06/18 2125 10/06/18 2354 10/07/18 0600  BP: 117/83 113/67 107/62 99/68  Pulse: (!) 59 70 66 61  Resp: 16 18 18 16   Temp: 97.8 F (36.6 C) 97.6 F (36.4 C) 98 F (36.7 C) 97.8 F (36.6 C)  TempSrc:  Oral  Oral  SpO2: 100% 99% 100% 100%  Weight:      Height:        LABS:  Recent Labs    10/06/18 0100 10/06/18 0629  WBC 9.4 15.7*  HGB 11.1* 10.3*  HCT 33.6* 32.0*  PLT 140* 132*    Blood type: --/--/O POS, O POS (04/03 0045)  Rubella: Immune (09/16 0000)   I&O: I/O last 3 completed shifts: In: 410.4 [I.V.:410.4] Out: 1920 [Urine:1000; Blood:920]          No intake/output data recorded.  Vaccines: TDaP UTD         Flu    UTD   Lungs: Clear and unlabored  Heart: regular rate and rhythm / no murmurs  Abdomen: soft, non-tender, non-distended             Fundus: firm, non-tender, U-1  Perineum: repair intact, no edema  Lochia: small  Extremities: no edema, no calf pain or tenderness    A/P: PPD # 1 33 y.o., [id number]   Principal Problem:   Postpartum care following vaginal delivery (4/3) Active Problems:   Encounter for planned induction of labor   SVD (spontaneous vaginal delivery)   Third degree  perineal laceration   Doing well - stable status  Routine post partum orders             DC home today w/ instructions  F/U at [facility] in 6 weeks and PRN     Neta Mends, MSN, CNM 10/07/2018, 9:33 AM

## 2018-10-23 DIAGNOSIS — N762 Acute vulvitis: Secondary | ICD-10-CM | POA: Diagnosis not present

## 2018-10-23 DIAGNOSIS — B373 Candidiasis of vulva and vagina: Secondary | ICD-10-CM | POA: Diagnosis not present

## 2019-08-10 ENCOUNTER — Other Ambulatory Visit: Payer: Self-pay | Admitting: Obstetrics

## 2019-08-10 DIAGNOSIS — Q632 Ectopic kidney: Secondary | ICD-10-CM

## 2019-08-15 ENCOUNTER — Ambulatory Visit
Admission: RE | Admit: 2019-08-15 | Discharge: 2019-08-15 | Disposition: A | Discharge status: Home / Self Care | Payer: 59 | Source: Ambulatory Visit | Attending: Obstetrics | Admitting: Obstetrics

## 2019-08-15 DIAGNOSIS — Q632 Ectopic kidney: Secondary | ICD-10-CM

## 2019-10-08 ENCOUNTER — Other Ambulatory Visit: Payer: Self-pay

## 2019-10-08 ENCOUNTER — Ambulatory Visit (INDEPENDENT_AMBULATORY_CARE_PROVIDER_SITE_OTHER): Payer: 59 | Admitting: Physician Assistant

## 2019-10-08 ENCOUNTER — Encounter: Payer: Self-pay | Admitting: Physician Assistant

## 2019-10-08 VITALS — BP 110/64 | HR 69 | Temp 98.6°F | Ht 69.0 in | Wt 134.8 lb

## 2019-10-08 DIAGNOSIS — I83813 Varicose veins of bilateral lower extremities with pain: Secondary | ICD-10-CM

## 2019-10-08 NOTE — Patient Instructions (Signed)
It was great to see you!  You will be contacted about your referral to [facility].  Take care,  Jarold Motto PA-C

## 2019-10-08 NOTE — Progress Notes (Signed)
Kristine Collins is a 34 y.o. female here for to establish care. Pt also has concerns with varicose veins in both legs. She developed these after given birth to her now one year old daughter. Right leg is painful with lots of pressure.  I acted as a Armed forces technical officer to [employer], PA-C [place], [place]  History of Present Illness:   Chief Complaint  Patient presents with  . New Patient (Initial Visit)  . Varicose Veins   Varicose veins Had worsening varicose veins with her recent third pregnancy. Last month, she had return of pain to an area of varicose veins on her R leg which concerned her. She reports that she has experienced sensitivity to the touch, achiness to her legs, heaviness to her legs.  Denies numbness/tingling. Wore compression stockings as much as able during pregnancy.  Feels relief of symptoms when her legs are elevated but doesn't have a lot of opportunities to do that given she has 3 young children at home.  Weight -- Weight: 134 lb 12.8 oz (61.1 kg)   Depression screen [facility] 2/9 10/08/2019  Decreased Interest 0  Down, Depressed, Hopeless 0  PHQ - 2 Score 0    No flowsheet data found.   Other providers/specialists: Patient Care Team: Jarold Motto, [place] as PCP - General (Physician Assistant) Noland Fordyce, MD as Consulting Physician (Obstetrics and Gynecology)   Past Medical History:  Diagnosis Date  . Medical history non-contributory      Social History   Socioeconomic History  . Marital status: Married    Spouse name: Not on file  . Number of children: Not on file  . Years of education: Not on file  . Highest education level: Not on file  Occupational History  . Not on file  Tobacco Use  . Smoking status: Never Smoker  . Smokeless tobacco: Never Used  Substance and Sexual Activity  . Alcohol use: Not Currently  . Drug use: Not Currently  . Sexual activity: Yes    Birth control/protection: None  Other Topics Concern  . Not on file  Social  History Narrative   Mom of 3   [facility]   Husband is a Education officer, environmental   From [employer]   Social Determinants of Health   Financial Resource Strain:   . Difficulty of Paying Living Expenses:   Food Insecurity:   . Worried About Programme researcher, broadcasting/film/video in the Last Year:   . Barista in the Last Year:   Transportation Needs:   . Freight forwarder (Medical):   Marland Kitchen Lack of Transportation (Non-Medical):   Physical Activity:   . Days of Exercise per Week:   . Minutes of Exercise per Session:   Stress:   . Feeling of Stress :   Social Connections:   . Frequency of Communication with Friends and Family:   . Frequency of Social Gatherings with Friends and Family:   . Attends Religious Services:   . Active Member of Clubs or Organizations:   . Attends Banker Meetings:   Marland Kitchen Marital Status:   Intimate Partner Violence:   . Fear of Current or Ex-Partner:   . Emotionally Abused:   Marland Kitchen Physically Abused:   . Sexually Abused:     Past Surgical History:  Procedure Laterality Date  . VAGINAL SEPTUM RESECTION  2013    Family History  Problem Relation Age of Onset  . Hypertension Mother   . Fibromyalgia Mother   . Hypertension Father   .  Heart attack Paternal Grandfather     No Known Allergies   Current Medications:   Current Outpatient Medications:  .  norethindrone-ethinyl estradiol (LOESTRIN FE) 1-20 MG-MCG tablet, Take 1 tablet by mouth daily., Disp: , Rfl:    Review of Systems:   ROS Negative unless otherwise specified per HPI.  Vitals:   Vitals:   10/08/19 1338  BP: 110/64  Pulse: 69  Temp: 98.6 F (37 C)  TempSrc: Temporal  SpO2: 98%  Weight: 134 lb 12.8 oz (61.1 kg)  Height: 5\' 9"  (1.753 m)      Body mass index is 19.91 kg/m.  Physical Exam:   Physical Exam Vitals and nursing note reviewed.  Constitutional:      General: She is not in acute distress.    Appearance: She is well-developed. She is not ill-appearing or toxic-appearing.   Cardiovascular:     Rate and Rhythm: Normal rate and regular rhythm.     Pulses: Normal pulses.          Dorsalis pedis pulses are 2+ on the right side and 2+ on the left side.       Posterior tibial pulses are 2+ on the right side and 2+ on the left side.     Heart sounds: Normal heart sounds, S1 normal and S2 normal.     Comments: No LE edema Pulmonary:     Effort: Pulmonary effort is normal.     Breath sounds: Normal breath sounds.  Skin:    General: Skin is warm and dry.     Comments: Torturous purple veins to inner R foot and L posterior calf  Neurological:     Mental Status: She is alert.     GCS: GCS eye subscore is 4. GCS verbal subscore is 5. GCS motor subscore is 6.  Psychiatric:        Speech: Speech normal.        Behavior: Behavior normal. Behavior is cooperative.      Assessment and Plan:   Shakeema was seen today for new patient (initial visit) and varicose veins.  Diagnoses and all orders for this visit:  Varicose veins of bilateral lower extremities with pain -     Ambulatory referral to Vascular Surgery   No red flags on exam. Referral to Vein and Vascular for further intervention and management  . Reviewed expectations re: course of current medical issues. . Discussed self-management of symptoms. . Outlined signs and symptoms indicating need for more acute intervention. . Patient verbalized understanding and all questions were answered. . See orders for this visit as documented in the electronic medical record. . Patient received an After-Visit Summary.  CMA or LPN served as scribe during this visit. History, Physical, and Plan performed by medical provider. The above documentation has been reviewed and is accurate and complete.   Inda Coke, PA-C

## 2019-11-20 ENCOUNTER — Other Ambulatory Visit: Payer: Self-pay | Admitting: *Deleted

## 2019-11-20 DIAGNOSIS — I83893 Varicose veins of bilateral lower extremities with other complications: Secondary | ICD-10-CM

## 2019-11-26 ENCOUNTER — Telehealth (HOSPITAL_COMMUNITY): Payer: Self-pay

## 2019-11-26 NOTE — Telephone Encounter (Signed)

## 2019-11-27 ENCOUNTER — Ambulatory Visit (HOSPITAL_COMMUNITY)
Admission: RE | Admit: 2019-11-27 | Discharge: 2019-11-27 | Disposition: A | Discharge status: Home / Self Care | Payer: 59 | Source: Ambulatory Visit | Attending: Surgery | Admitting: Surgery

## 2019-11-27 ENCOUNTER — Encounter: Payer: Self-pay | Admitting: Vascular Surgery

## 2019-11-27 ENCOUNTER — Other Ambulatory Visit: Payer: Self-pay

## 2019-11-27 ENCOUNTER — Ambulatory Visit: Payer: 59 | Admitting: Vascular Surgery

## 2019-11-27 DIAGNOSIS — I83893 Varicose veins of bilateral lower extremities with other complications: Secondary | ICD-10-CM | POA: Diagnosis present

## 2019-11-27 DIAGNOSIS — I872 Venous insufficiency (chronic) (peripheral): Secondary | ICD-10-CM

## 2019-11-27 NOTE — Progress Notes (Signed)
Patient name: Kristine Collins MRN: [id number] DOB: Dec 15, 1985 Sex: female  REASON FOR CONSULT: Evaluate varicose veins right lower extremity  HPI: Kristine Collins is a 34 y.o. female, with no significant past medical history that presents for evaluation of varicose veins.  Patient states she has noticed varicosities in both lower extremities but her right leg has been particularly troublesome.  This first started after she got pregnant with her second child.  Subsequently improved after she delivered and then got worse again with her third child.  She states her right leg feels heavy achy burning and stinging particularly worse at the end of the day.  Worse in the calf.  No history thromboembolic events.  No trauma.  Was wearing compression during her pregnancy, but then stopped.    Past Medical History:  Diagnosis Date  . Medical history non-contributory     Past Surgical History:  Procedure Laterality Date  . VAGINAL SEPTUM RESECTION  2013    Family History  Problem Relation Age of Onset  . Hypertension Mother   . Fibromyalgia Mother   . Hypertension Father   . Heart attack Paternal Grandfather     SOCIAL HISTORY: Social History   Socioeconomic History  . Marital status: Married    Spouse name: Not on file  . Number of children: Not on file  . Years of education: Not on file  . Highest education level: Not on file  Occupational History  . Not on file  Tobacco Use  . Smoking status: Never Smoker  . Smokeless tobacco: Never Used  Substance and Sexual Activity  . Alcohol use: Not Currently  . Drug use: Not Currently  . Sexual activity: Yes    Birth control/protection: None  Other Topics Concern  . Not on file  Social History Narrative   Mom of 3   [facility]   Husband is a Theme park manager   From [place] Strain:   . Difficulty of Paying Living Expenses:   Food Insecurity:   . Worried About Charity fundraiser in the Last Year:    . Arboriculturist in the Last Year:   Transportation Needs:   . Film/video editor (Medical):   Marland Kitchen Lack of Transportation (Non-Medical):   Physical Activity:   . Days of Exercise per Week:   . Minutes of Exercise per Session:   Stress:   . Feeling of Stress :   Social Connections:   . Frequency of Communication with Friends and Family:   . Frequency of Social Gatherings with Friends and Family:   . Attends Religious Services:   . Active Member of Clubs or Organizations:   . Attends Archivist Meetings:   Marland Kitchen Marital Status:   Intimate Partner Violence:   . Fear of Current or Ex-Partner:   . Emotionally Abused:   Marland Kitchen Physically Abused:   . Sexually Abused:     No Known Allergies  Current Outpatient Medications  Medication Sig Dispense Refill  . norethindrone-ethinyl estradiol (LOESTRIN FE) 1-20 MG-MCG tablet Take 1 tablet by mouth daily.     No current facility-administered medications for this visit.    REVIEW OF SYSTEMS:  [X]  denotes positive finding, [ ]  denotes negative finding Cardiac  Comments:  Chest pain or chest pressure:    Shortness of breath upon exertion:    Short of breath when lying flat:    Irregular heart rhythm:  Vascular    Pain in calf, thigh, or hip brought on by ambulation:    Pain in feet at night that wakes you up from your sleep:     Blood clot in your veins:    Leg swelling:         Pulmonary    Oxygen at home:    Productive cough:     Wheezing:         Neurologic    Sudden weakness in arms or legs:     Sudden numbness in arms or legs:     Sudden onset of difficulty speaking or slurred speech:    Temporary loss of vision in one eye:     Problems with dizziness:         Gastrointestinal    Blood in stool:     Vomited blood:         Genitourinary    Burning when urinating:     Blood in urine:        Psychiatric    Major depression:         Hematologic    Bleeding problems:    Problems with blood clotting  too easily:        Skin    Rashes or ulcers:        Constitutional    Fever or chills:      PHYSICAL EXAM: Vitals:   11/27/19 1354  BP: 104/65  Pulse: 70  Resp: 14  Temp: 97.9 F (36.6 C)  TempSrc: Temporal  SpO2: 100%  Weight: 133 lb (60.3 kg)  Height: 5\' 9"  (1.753 m)    GENERAL: The patient is a well-nourished female, in no acute distress. The vital signs are documented above. CARDIAC: There is a regular rate and rhythm.  VASCULAR:  2+ palpable femoral pulses bilaterally 2+ palpable dorsalis pedis and posterior tibial pulses bilaterally Bilateral lower calves with some small varicosities medially, no open ulcerations, tender over varicosity.   PULMONARY: There is good air exchange bilaterally without wheezing or rales. ABDOMEN: Soft and non-tender with normal pitched bowel sounds.  MUSCULOSKELETAL: There are no major deformities or cyanosis. NEUROLOGIC: No focal weakness or paresthesias are detected. SKIN: There are no ulcers or rashes noted. PSYCHIATRIC: The patient has a normal affect.  DATA:   Right lower extremity venous reflux study shows deep reflux in the common femoral vein as well as superficial reflux at the saphenofemoral junction and throughout the great saphenous vein in the thigh as well as at the knee and proximal calf.  The great saphenous vein measures 4 to 7 mm.  No evidence of DVT.  Assessment/Plan:  34 year old female with venous insufficiency of the right lower extremity with CEAP classification C2.  She has evidence of both superficial and deep reflux based on her study today with reflux in the common femoral vein as well as the great saphenous vein.  We discussed conservative measures at this time with thigh-high compression with 20 to 30 mmHg as well as elevation.  I will bring her back in 3 months to see Dr. 38 or Dr. Edilia Bo after she has had a trial of medical compression.  I think she would likely be a good candidate for laser ablation of  the right GSV given that she has significant reflux at the saphenofemoral junction as well as throughout the great saphenous vein in the thigh and into the proximal calf and this appears to be large enough for intervention.  We measured her for  compression socks today.    Cephus Shelling, MD [facility] [place] Office: [phone]
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
## 2020-01-08 ENCOUNTER — Ambulatory Visit: Payer: 59 | Admitting: Physician Assistant

## 2020-01-08 ENCOUNTER — Encounter: Payer: Self-pay | Admitting: Physician Assistant

## 2020-01-08 ENCOUNTER — Other Ambulatory Visit: Payer: Self-pay

## 2020-01-08 VITALS — BP 116/68 | HR 61 | Temp 98.5°F | Ht 69.0 in | Wt 134.8 lb

## 2020-01-08 DIAGNOSIS — L608 Other nail disorders: Secondary | ICD-10-CM

## 2020-01-08 DIAGNOSIS — R0789 Other chest pain: Secondary | ICD-10-CM

## 2020-01-08 DIAGNOSIS — R5383 Other fatigue: Secondary | ICD-10-CM | POA: Diagnosis not present

## 2020-01-08 LAB — COMPREHENSIVE METABOLIC PANEL
ALT: 10 U/L (ref 0–35)
AST: 17 U/L (ref 0–37)
Albumin: 4.7 g/dL (ref 3.5–5.2)
Alkaline Phosphatase: 40 U/L (ref 39–117)
BUN: 13 mg/dL (ref 6–23)
CO2: 26 mEq/L (ref 19–32)
Calcium: 9.6 mg/dL (ref 8.4–10.5)
Chloride: 104 mEq/L (ref 96–112)
Creatinine, Ser: 0.85 mg/dL (ref 0.40–1.20)
GFR: 77.71 mL/min (ref >60.00)
Glucose, Bld: 78 mg/dL (ref 70–99)
Potassium: 4.1 mEq/L (ref 3.5–5.1)
Sodium: 139 mEq/L (ref 135–145)
Total Bilirubin: 0.6 mg/dL (ref 0.2–1.2)
Total Protein: 7.3 g/dL (ref 6.0–8.3)

## 2020-01-08 LAB — CBC WITH DIFFERENTIAL/PLATELET
Basophils Absolute: 0 10*3/uL (ref 0.0–0.1)
Basophils Relative: 1.2 % (ref 0.0–3.0)
Eosinophils Absolute: 0 10*3/uL (ref 0.0–0.7)
Eosinophils Relative: 0.8 % (ref 0.0–5.0)
HCT: 41.2 % (ref 36.0–46.0)
Hemoglobin: 14.1 g/dL (ref 12.0–15.0)
Lymphocytes Relative: 37 % (ref 12.0–46.0)
Lymphs Abs: 1.3 10*3/uL (ref 0.7–4.0)
MCHC: 34.1 g/dL (ref 30.0–36.0)
MCV: 91.4 fl (ref 78.0–100.0)
Monocytes Absolute: 0.4 10*3/uL (ref 0.1–1.0)
Monocytes Relative: 10.4 % (ref 3.0–12.0)
Neutro Abs: 1.8 10*3/uL (ref 1.4–7.7)
Neutrophils Relative %: 50.6 % (ref 43.0–77.0)
Platelets: 173 10*3/uL (ref 150.0–400.0)
RBC: 4.5 Mil/uL (ref 3.87–5.11)
RDW: 12.3 % (ref 11.5–15.5)
WBC: 3.5 10*3/uL — ABNORMAL LOW (ref 4.0–10.5)

## 2020-01-08 LAB — IRON,TIBC AND FERRITIN PANEL
%SAT: 21 % (calc) (ref 16–45)
Ferritin: 29 ng/mL (ref 16–154)
Iron: 93 ug/dL (ref 40–190)
TIBC: 442 mcg/dL (calc) (ref 250–450)

## 2020-01-08 LAB — VITAMIN B12: Vitamin B-12: 278 pg/mL (ref 211–911)

## 2020-01-08 LAB — T4, FREE: Free T4: 0.89 ng/dL (ref 0.60–1.60)

## 2020-01-08 LAB — TSH: TSH: 1.86 u[IU]/mL (ref 0.35–4.50)

## 2020-01-08 LAB — POCT URINE PREGNANCY: Preg Test, Ur: NEGATIVE

## 2020-01-08 MED ORDER — MUPIROCIN 2 % EX OINT: TOPICAL_OINTMENT | CUTANEOUS | 0 refills | Status: DC

## 2020-01-08 NOTE — Patient Instructions (Signed)
It was great to see you!  For your toe: -Keep area protected -I suspect that nail will fall off at some point soon, especially if you have repeat trauma -May use bactroban to area where nail when in if it begins to look infected -Will start oral terbinafine for your nail if your labs allow -If you'd like - we can get a second opinion with a podiatrist.  For your fatigue: -I will call you with your labs and any specific recommendations, as well as a plan for follow-up.  For your chest discomfort: -EKG looks fine -I would actually like to send to sports medicine to see if they can help [place] if you are agreeable.   Take care,  Jarold Motto PA-C
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
## 2020-01-09 ENCOUNTER — Other Ambulatory Visit: Payer: Self-pay | Admitting: Physician Assistant

## 2020-01-09 MED ORDER — TERBINAFINE HCL 250 MG PO TABS: ORAL_TABLET | ORAL | 0 refills | Status: DC

## 2020-02-28 ENCOUNTER — Ambulatory Visit: Payer: 59 | Admitting: Vascular Surgery

## 2020-05-20 ENCOUNTER — Encounter: Payer: Self-pay | Admitting: Physician Assistant

## 2020-05-20 ENCOUNTER — Ambulatory Visit: Payer: 59 | Admitting: Physician Assistant

## 2020-05-20 ENCOUNTER — Ambulatory Visit (INDEPENDENT_AMBULATORY_CARE_PROVIDER_SITE_OTHER)
Admission: RE | Admit: 2020-05-20 | Discharge: 2020-05-20 | Disposition: A | Discharge status: Home / Self Care | Payer: 59 | Source: Ambulatory Visit | Attending: Physician Assistant | Admitting: Physician Assistant

## 2020-05-20 ENCOUNTER — Other Ambulatory Visit: Payer: Self-pay

## 2020-05-20 VITALS — BP 100/64 | HR 66 | Temp 97.9°F | Ht 69.0 in | Wt 135.5 lb

## 2020-05-20 DIAGNOSIS — M79674 Pain in right toe(s): Secondary | ICD-10-CM | POA: Diagnosis not present

## 2020-05-20 MED ORDER — DOXYCYCLINE HYCLATE 100 MG PO TABS
100.0000 mg | ORAL_TABLET | Freq: Two times a day (BID) | ORAL | 0 refills | Status: AC
Start: 2020-05-20 — End: ?

## 2020-05-20 NOTE — Progress Notes (Signed)
Kristine Collins is a 34 y.o. female here for a new problem.  I acted as a Neurosurgeon for [employer], PA-C Corky Mull, LPN   History of Present Illness:   Chief Complaint  Patient presents with  . Edema    HPI   R great toe redness and pain Pt c/o right great toe swollen, yesterday reddish purple and painful. Symptoms of pain and erythema started yesterday. She had a pedicure on 05/09/20, has had one in the past. Has been treating the nail x 7 month with terbinafine -- feels like this has improved her toenail appearance. Advil has provided little relief.   Reports that she recently had a "sore" on the bottom of her nail fold on her R great toe. Denies drainage, fever, chills, numbness/tingling. Area was warm to touch yesterday.  Patient's last menstrual period was 04/06/2020.   Past Medical History:  Diagnosis Date  . Medical history non-contributory      Social History   Tobacco Use  . Smoking status: Never Smoker  . Smokeless tobacco: Never Used  Vaping Use  . Vaping Use: Never used  Substance Use Topics  . Alcohol use: Not Currently  . Drug use: Not Currently    Past Surgical History:  Procedure Laterality Date  . VAGINAL SEPTUM RESECTION  2013    Family History  Problem Relation Age of Onset  . Hypertension Mother   . Fibromyalgia Mother   . Hypertension Father   . Heart attack Paternal Grandfather     No Known Allergies  Current Medications:   Current Outpatient Medications:  .  norgestimate-ethinyl estradiol (ORTHO-CYCLEN) 0.25-35 MG-MCG tablet, Take by mouth., Disp: , Rfl:  .  doxycycline (VIBRA-TABS) 100 MG tablet, Take 1 tablet (100 mg total) by mouth 2 (two) times daily., Disp: 20 tablet, Rfl: 0   Review of Systems:   ROS Negative unless otherwise specified per HPI.  Vitals:   Vitals:   05/20/20 1501  BP: 100/64  Pulse: 66  Temp: 97.9 F (36.6 C)  TempSrc: Temporal  SpO2: 98%  Weight: 135 lb 8 oz (61.5 kg)  Height: 5\' 9"  (1.753  m)     Body mass index is 20.01 kg/m.  Physical Exam:   Physical Exam Constitutional:      Appearance: She is well-developed.  HENT:     Head: Normocephalic and atraumatic.  Eyes:     Conjunctiva/sclera: Conjunctivae normal.  Cardiovascular:     Comments: Good cap refill in R toes Pulmonary:     Effort: Pulmonary effort is normal.  Musculoskeletal:        General: Normal range of motion.     Cervical back: Normal range of motion and neck supple.  Feet:     Comments: R great toe -- distal joint with erythema. Nail thickened and deformed. Generalized TTP to R great tip of toe and PIP joint. Normal ROM. Skin:    General: Skin is warm and dry.  Neurological:     Mental Status: She is alert and oriented to person, place, and time.     Comments: Normal sensation to R toes  Psychiatric:        Behavior: Behavior normal.        Thought Content: Thought content normal.        Judgment: Judgment normal.     Assessment and Plan:   Gwyndolyn was seen today for edema.  Diagnoses and all orders for this visit:  Pain of toe of right foot Concern for  infection. Start oral doxycycline. Will send for xray to r/o significant infection/trauma. Referral to podiatry for further evaluation and management. -     DG Toe Great Right; Future -     Ambulatory referral to Podiatry  Other orders -     doxycycline (VIBRA-TABS) 100 MG tablet; Take 1 tablet (100 mg total) by mouth 2 (two) times daily.   CMA or LPN served as scribe during this visit. History, Physical, and Plan performed by medical provider. The above documentation has been reviewed and is accurate and complete.  Jarold Motto, PA-C

## 2020-05-20 NOTE — Patient Instructions (Signed)
It was great to see you!  Start oral doxycycline.  An order for an xray has been put in for you. To get your xray, you can walk in at the [facility] location without a scheduled appointment.  The address is [address] It is across the street from [facility]. X-ray is located in the basement.  Hours of operation are M-F 8:30am to 5:00pm. Please note that they are closed for lunch between 12:30 and 1:00pm.  Referral for podiatry has been put in, they will contact you for an appointment.  Ibuprofen for pain.  If symptoms worsen in the meantime, please let us know.  Take care,  Jarold Motto PA-C

## 2020-06-06 ENCOUNTER — Other Ambulatory Visit: Payer: Self-pay

## 2020-06-06 ENCOUNTER — Ambulatory Visit: Payer: 59 | Admitting: Podiatry

## 2020-06-06 DIAGNOSIS — L601 Onycholysis: Secondary | ICD-10-CM

## 2020-06-06 DIAGNOSIS — M79676 Pain in unspecified toe(s): Secondary | ICD-10-CM | POA: Diagnosis not present

## 2020-06-06 DIAGNOSIS — L603 Nail dystrophy: Secondary | ICD-10-CM

## 2020-06-06 NOTE — Progress Notes (Signed)
  Subjective:  Patient ID: Kristine Collins, female    DOB: 1986/04/23,  MRN: [id number]  Chief Complaint  Patient presents with  . Nail Problem    Right 1st toenail history of repeat injuries, has had Doxycycline in past month due to brusing/pain. Pt states pain and bruising resolved but she is concerned with the appearance of the nail.   34 y.o. female presents with the above complaint. History confirmed with patient.   Objective:  Physical Exam: warm, good capillary refill, no trophic changes or ulcerative lesions, normal DP and PT pulses and normal sensory exam.  Painful loosened nail at the right, hallux; without warmth, erythema or drainage  Assessment:   1. Onycholysis   2. Pain around toenail   3. Nail dystrophy    Plan:  Patient was evaluated and treated and all questions answered.  Ingrown Nail, right  -Loosened nail removed. -Educated on post-procedure care including soaking. Written instructions provided.  Procedure: Avulsion of toenail Location: Right 1st toe  Anesthesia: Lidocaine 1% plain; 1.5 mL and Marcaine 0.5% plain; 1.5 mL, digital block. Skin Prep: Betadine. Dressing: Silvadene; telfa; dry, sterile, compression dressing. Technique: Following skin prep, the toe was exsanguinated and a tourniquet was secured at the base of the toe. The nail was freed and avulsed with a hemostat. The area was cleansed. The tourniquet was then removed and sterile dressing applied. Disposition: Patient tolerated procedure well.   Return in about 2 weeks (around 06/20/2020).

## 2020-06-06 NOTE — Patient Instructions (Signed)

## 2020-06-10 ENCOUNTER — Telehealth: Payer: Self-pay | Admitting: *Deleted

## 2020-06-10 ENCOUNTER — Encounter: Payer: Self-pay | Admitting: Podiatry

## 2020-06-10 MED ORDER — CEPHALEXIN 500 MG PO CAPS: 500.0000 mg | ORAL_CAPSULE | Freq: Two times a day (BID) | ORAL | 0 refills | Status: AC

## 2020-06-10 NOTE — Telephone Encounter (Signed)
Patient is concerned about her right great toe after nail precedure 1 week ago. She states that the nail is red and dark purple,"looks gross"but no unusual pain. She has been soaking as instructed and the toe seems to calm down w/ the soaking and resting. Please advise.

## 2020-06-10 NOTE — Telephone Encounter (Signed)
Called patient and informed per Dr Samuella Cota. She said that she will try to send a picture today.

## 2020-06-10 NOTE — Telephone Encounter (Signed)
It sounds like it's healing normally. Can she send [place] a picture?

## 2021-03-25 ENCOUNTER — Encounter: Payer: Self-pay | Admitting: Physician Assistant

## 2021-03-26 ENCOUNTER — Ambulatory Visit: Payer: BC Managed Care – PPO | Admitting: Physician Assistant

## 2021-03-26 ENCOUNTER — Other Ambulatory Visit: Payer: Self-pay

## 2021-03-26 ENCOUNTER — Encounter: Payer: Self-pay | Admitting: Physician Assistant

## 2021-03-26 VITALS — BP 100/66 | HR 53 | Temp 98.3°F | Ht 69.0 in | Wt 133.0 lb

## 2021-03-26 DIAGNOSIS — L249 Irritant contact dermatitis, unspecified cause: Secondary | ICD-10-CM | POA: Diagnosis not present

## 2021-03-26 MED ORDER — CLOTRIMAZOLE-BETAMETHASONE 1-0.05 % EX CREA: 1.0000 "application " | TOPICAL_CREAM | Freq: Every day | CUTANEOUS | 0 refills | Status: AC

## 2021-03-26 NOTE — Patient Instructions (Signed)
Lotrisone cream as directed. Do not use Benadryl cream.

## 2021-03-26 NOTE — Progress Notes (Signed)
   Subjective:    Patient ID: Kristine Collins, female    DOB: Mar 06, 1986, 35 y.o.   MRN: [id number]  Chief Complaint  Patient presents with   Rash    Right upper forearm; She noticed on Saturday. She has used OTC, a antifungal and hydrocortisone cream. She has also used Benadryl, but rash still has not subsided. It has mildly increased in size.     HPI Patient is in today for rash right forearm x 5 days. Started the day after she gave her dog a bath. She has tried OTC antifugal and hydrocortisone cream without any relief. Very itching. No other symptoms.   Past Medical History:  Diagnosis Date   Medical history non-contributory     Past Surgical History:  Procedure Laterality Date   VAGINAL SEPTUM RESECTION  2013    Family History  Problem Relation Age of Onset   Hypertension Mother    Fibromyalgia Mother    Hypertension Father    Heart attack Paternal Grandfather     Social History   Tobacco Use   Smoking status: Never   Smokeless tobacco: Never  Vaping Use   Vaping Use: Never used  Substance Use Topics   Alcohol use: Not Currently   Drug use: Not Currently     No Known Allergies  Review of Systems REFER TO HPI FOR PERTINENT POSITIVES AND NEGATIVES      Objective:     BP 100/66   Pulse (!) 53   Temp 98.3 F (36.8 C) (Temporal)   Ht 5\' 9"  (1.753 m)   Wt 133 lb (60.3 kg)   SpO2 100%   BMI 19.64 kg/m   Wt Readings from Last 3 Encounters:  03/26/21 133 lb (60.3 kg)  05/20/20 135 lb 8 oz (61.5 kg)  01/08/20 134 lb 12.8 oz (61.1 kg)    BP Readings from Last 3 Encounters:  03/26/21 100/66  05/20/20 100/64  01/08/20 116/68     Physical Exam Vitals and nursing note reviewed.  Constitutional:      Appearance: Normal appearance.  Skin:    Comments: R posterior forearm there is a 2 cm raised linear erythematous rash  Neurological:     Mental Status: She is alert.       Assessment & Plan:   Problem List Items Addressed This Visit    None Visit Diagnoses     Irritant contact dermatitis, unspecified trigger    -  Primary       1. Irritant contact dermatitis, unspecified trigger -Rash R upper arm appears c/w contact dermatitis -Will try Lotrisone cream thin layer application BID, no longer than 10-14 days -She will recheck prn   Jakyra Kenealy M Nishi Neiswonger, PA-C

## 2021-05-30 DIAGNOSIS — J189 Pneumonia, unspecified organism: Secondary | ICD-10-CM | POA: Diagnosis not present

## 2021-05-30 DIAGNOSIS — R509 Fever, unspecified: Secondary | ICD-10-CM | POA: Diagnosis not present

## 2021-05-30 DIAGNOSIS — R059 Cough, unspecified: Secondary | ICD-10-CM | POA: Diagnosis not present

## 2021-05-30 DIAGNOSIS — R918 Other nonspecific abnormal finding of lung field: Secondary | ICD-10-CM | POA: Diagnosis not present
